# Patient Record
Sex: Female | Born: 1960 | Race: White | Hispanic: No | Marital: Single | Smoking: Current every day smoker
Health system: Southern US, Community
[De-identification: ages and names within clinical notes are randomized; demographics above are authoritative.]

## PROBLEM LIST (undated history)

## (undated) HISTORY — PX: TRACHEOSTOMY: SUR1362

## (undated) HISTORY — PX: BACK SURGERY: SHX140

---

## 2004-01-02 ENCOUNTER — Other Ambulatory Visit: Payer: Self-pay

## 2004-01-03 ENCOUNTER — Other Ambulatory Visit: Payer: Self-pay

## 2017-06-15 ENCOUNTER — Emergency Department: Payer: Self-pay

## 2017-06-15 ENCOUNTER — Encounter: Payer: Self-pay | Admitting: Emergency Medicine

## 2017-06-15 ENCOUNTER — Emergency Department
Admission: EM | Admit: 2017-06-15 | Discharge: 2017-06-15 | Disposition: A | Discharge status: Home / Self Care | Payer: Self-pay | Attending: Emergency Medicine | Admitting: Emergency Medicine

## 2017-06-15 DIAGNOSIS — F17210 Nicotine dependence, cigarettes, uncomplicated: Secondary | ICD-10-CM | POA: Insufficient documentation

## 2017-06-15 DIAGNOSIS — M7552 Bursitis of left shoulder: Secondary | ICD-10-CM | POA: Insufficient documentation

## 2017-06-15 MED ORDER — HYDROCODONE-ACETAMINOPHEN 5-325 MG PO TABS: 1.0000 | ORAL_TABLET | Freq: Four times a day (QID) | ORAL | 0 refills | Status: AC | PRN

## 2017-06-15 MED ORDER — HYDROCODONE-ACETAMINOPHEN 5-325 MG PO TABS
1.0000 | ORAL_TABLET | Freq: Once | ORAL | Status: AC
  Administered 2017-06-15: 1 via ORAL
  Filled 2017-06-15: qty 1

## 2017-06-15 MED ORDER — DEXAMETHASONE SODIUM PHOSPHATE 10 MG/ML IJ SOLN
10.0000 mg | Freq: Once | INTRAMUSCULAR | Status: AC
  Administered 2017-06-15: 10 mg via INTRAMUSCULAR
  Filled 2017-06-15: qty 1

## 2017-06-15 NOTE — Discharge Instructions (Signed)
1. Take pain medicine as needed (Norco #15). 2. Use sling as needed for comfort. 3. Return to the ER for worsening symptoms, persistent vomiting, difficulty breathing or other disorders.

## 2017-06-15 NOTE — ED Triage Notes (Signed)
Patient ambulatory to triage with steady gait, without difficulty or distress noted; pt reports left shoulder pain x 2wks that increases with movement; st hx of same

## 2017-06-15 NOTE — ED Provider Notes (Signed)
[facility] Emergency Department Provider Note   ____________________________________________   First MD Initiated Contact with Patient 06/15/17 [phone]     (approximate)  I have reviewed the triage vital signs and the nursing notes.   HISTORY  Chief Complaint Shoulder Injury    HPI Michelle NunneryCarol Boone is a [age] y.o. female who presents to the ED from home with a chief complaint of nontraumatic left shoulder pain. Patient reports left shoulder injury 15 years ago; never followed up with orthopedic secondary to finances. 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 Patient is right hand dominant. complains of sharp left shoulder pain which is exacerbated with movement. Denies associated fever, chills, chest pain, shortness of breath, abdominal pain, nausea, vomiting. Denies recent travel or trauma. Nothing makes her symptoms better.   Past medical history  None  There are no active problems to display for this patient.   Past Surgical History:  Procedure Laterality Date  . BACK SURGERY    . CESAREAN SECTION    . TRACHEOSTOMY      Prior to Admission medications   Not on File    Allergies Aspirin  No family history on file.  Social History Social History  Substance Use Topics  . Smoking status: Current Every Day Smoker  . Smokeless tobacco: Never Used  . Alcohol use No    Review of Systems  Constitutional: No fever/chills. Eyes: No visual changes. ENT: No sore throat. Cardiovascular: Denies chest pain. Respiratory: Denies shortness of breath. Gastrointestinal: No abdominal pain.  No nausea, no vomiting.  No diarrhea.  No constipation. Genitourinary: Negative for dysuria. Musculoskeletal: positive for left shoulder pain. Negative for back pain. Skin: Negative for rash. Neurological: Negative for headaches, focal weakness or numbness.   ____________________________________________   PHYSICAL EXAM:  VITAL  SIGNS: ED Triage Vitals  Enc Vitals Group     BP 06/15/17 0526 (!) 195/112     Pulse Rate 06/15/17 0526 98     Resp 06/15/17 0526 18     Temp 06/15/17 0526 98.8 F (37.1 C)     Temp Source 06/15/17 0526 Oral     SpO2 06/15/17 0526 100 %     Weight 06/15/17 0521 141 lb (64 kg)     Height 06/15/17 0521 5\' 7"  (1.702 m)     Head Circumference --      Peak Flow --      Pain Score 06/15/17 0521 10     Pain Loc --      Pain Edu? --      Excl. in GC? --     Constitutional: Alert and oriented. Well appearing and in mild acute distress. Eyes: Conjunctivae are normal. PERRL. EOMI. Head: Atraumatic. Nose: No congestion/rhinnorhea. Mouth/Throat: Mucous membranes are moist.  Oropharynx non-erythematous. Neck: No stridor.  No cervical spine tenderness to palpation. Cardiovascular: Normal rate, regular rhythm. Grossly normal heart sounds.  Good peripheral circulation. Respiratory: Normal respiratory effort.  No retractions. Lungs CTAB. Gastrointestinal: Soft and nontender. No distention. No abdominal bruits. No CVA tenderness. Musculoskeletal: Left posterior shoulder tender to palpation. No deformities, swelling or external evidence of injuries noted. Limited range of motion secondary to pain. 2+ radial pulses. Brisk, less than 5 second capillary refill. 5/5 motor strength and sensation intact. Neurologic:  Normal speech and language. No gross focal neurologic deficits are appreciated. No gait instability. Skin:  Skin is warm, dry and intact. No rash noted. Psychiatric: Mood and affect are normal.  Speech and behavior are normal.  ____________________________________________   LABS (all labs ordered are listed, but only abnormal results are displayed)  Labs Reviewed - No data to display ____________________________________________  EKG  None ____________________________________________  RADIOLOGY  Dg Shoulder Left  Result Date: 06/15/2017 CLINICAL DATA:  56 y/o  F; 2 weeks of left  shoulder pain. EXAM: LEFT SHOULDER - 2+ VIEW COMPARISON:  None. FINDINGS: There is no evidence of fracture or dislocation. There is no evidence of arthropathy or other focal bone abnormality. Soft tissues are unremarkable. IMPRESSION: Negative. Electronically Signed   By: Mitzi Hansen M.D.   On: 06/15/2017 05:40    ____________________________________________   PROCEDURES  Procedure(s) performed: None  Procedures  Critical Care performed: No  ____________________________________________   INITIAL IMPRESSION / ASSESSMENT AND PLAN / ED COURSE  As part of my medical decision making, I reviewed the following data within the electronic MEDICAL RECORD NUMBER Nursing notes reviewed and incorporated, Old chart reviewed, Radiograph reviewed and Notes from prior ED visits.   56 year old female who presents with left shoulder pain secondary to bursitis. 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 Denies chest pain or shortness of breath; no suspicion for ACS. Hypertension noted which is most likely secondary to pain. Will place in sling, IM Decadron, analgesia. Strongly encouraged patient to follow-up with orthopedics. Strict return precautions given. Patient verbalizes understanding and agrees with plan of care.      ____________________________________________   FINAL CLINICAL IMPRESSION(S) / ED DIAGNOSES  Final diagnoses:  Bursitis of left shoulder      NEW MEDICATIONS STARTED DURING THIS VISIT:  New Prescriptions   No medications on file     Note:  This document was prepared using Dragon voice recognition software and may include unintentional dictation errors.    Irean Hong, MD 06/15/17 [phone]

## 2019-04-22 ENCOUNTER — Emergency Department
Admission: EM | Admit: 2019-04-22 | Discharge: 2019-04-22 | Disposition: A | Discharge status: Home / Self Care | Payer: Self-pay | Attending: Emergency Medicine | Admitting: Emergency Medicine

## 2019-04-22 ENCOUNTER — Emergency Department: Payer: Self-pay

## 2019-04-22 ENCOUNTER — Other Ambulatory Visit: Payer: Self-pay

## 2019-04-22 ENCOUNTER — Encounter: Payer: Self-pay | Admitting: Emergency Medicine

## 2019-04-22 DIAGNOSIS — R079 Chest pain, unspecified: Secondary | ICD-10-CM

## 2019-04-22 DIAGNOSIS — F17200 Nicotine dependence, unspecified, uncomplicated: Secondary | ICD-10-CM | POA: Insufficient documentation

## 2019-04-22 DIAGNOSIS — R0602 Shortness of breath: Secondary | ICD-10-CM | POA: Insufficient documentation

## 2019-04-22 LAB — BASIC METABOLIC PANEL
Anion gap: 9 (ref 5–15)
BUN: 17 mg/dL (ref 6–20)
CO2: 27 mmol/L (ref 22–32)
Calcium: 9.5 mg/dL (ref 8.9–10.3)
Chloride: 106 mmol/L (ref 98–111)
Creatinine, Ser: 1.06 mg/dL — ABNORMAL HIGH (ref 0.44–1.00)
GFR calc Af Amer: >60 mL/min (ref >60)
GFR calc non Af Amer: 58 mL/min — ABNORMAL LOW (ref >60)
Glucose, Bld: 89 mg/dL (ref 70–99)
Potassium: 4 mmol/L (ref 3.5–5.1)
Sodium: 142 mmol/L (ref 135–145)

## 2019-04-22 LAB — CBC
HCT: 44.9 % (ref 36.0–46.0)
Hemoglobin: 14.5 g/dL (ref 12.0–15.0)
MCH: 28.2 pg (ref 26.0–34.0)
MCHC: 32.3 g/dL (ref 30.0–36.0)
MCV: 87.2 fL (ref 80.0–100.0)
Platelets: 354 10*3/uL (ref 150–400)
RBC: 5.15 MIL/uL — ABNORMAL HIGH (ref 3.87–5.11)
RDW: 13.1 % (ref 11.5–15.5)
WBC: 10.4 10*3/uL (ref 4.0–10.5)
nRBC: 0 % (ref 0.0–0.2)

## 2019-04-22 LAB — TROPONIN I (HIGH SENSITIVITY)
Troponin I (High Sensitivity): 3 ng/L (ref <18)
Troponin I (High Sensitivity): 3 ng/L (ref <18)

## 2019-04-22 MED ORDER — LIDOCAINE 5 % EX PTCH
1.0000 | MEDICATED_PATCH | CUTANEOUS | Status: DC
  Administered 2019-04-22: 09:00:00 1 via TRANSDERMAL
  Filled 2019-04-22: qty 1

## 2019-04-22 MED ORDER — SODIUM CHLORIDE 0.9% FLUSH: 3.0000 mL | Freq: Once | INTRAVENOUS | Status: DC

## 2019-04-22 MED ORDER — ASPIRIN 81 MG PO CHEW
324.0000 mg | CHEWABLE_TABLET | Freq: Once | ORAL | Status: AC
  Administered 2019-04-22: 324 mg via ORAL
  Filled 2019-04-22: qty 4

## 2019-04-22 MED ORDER — LIDOCAINE 5 % EX PTCH: 1.0000 | MEDICATED_PATCH | Freq: Two times a day (BID) | CUTANEOUS | 0 refills | Status: AC

## 2019-04-22 NOTE — ED Provider Notes (Signed)
[facility] Emergency Department Provider Note   ____________________________________________   First MD Initiated Contact with Patient 04/22/19 [phone]     (approximate)  I have reviewed the triage vital signs and the nursing notes.   HISTORY  Chief Complaint Chest Pain    HPI Michelle NunneryCarol Boone is a 58 y.o. female with no significant past medical history who presents to the ED complaining of chest pain.  Patient reports she has been dealing with intermittent pain in her chest over the past couple of months.  She describes it as a sharp pain primarily in the left side of her chest and at her mid sternum, which is exacerbated when she pushes on her chest.  She states it is not associated with exercise or taking a deep breath.  She does states she will occasionally feel short of breath, but she denies any fevers or cough.  She smokes approximately 10 cigarettes/day, reports a family history of heart disease in the 6950s, but denies any personal medical history.        History reviewed. No pertinent past medical history.  There are no active problems to display for this patient.   Past Surgical History:  Procedure Laterality Date  . BACK SURGERY    . CESAREAN SECTION    . TRACHEOSTOMY      Prior to Admission medications   Medication Sig Start Date End Date Taking? Authorizing Provider  HYDROcodone-acetaminophen (NORCO) 5-325 MG tablet Take 1 tablet by mouth every 6 (six) hours as needed for moderate pain. 06/15/17   Irean HongSung, Jade J, MD  lidocaine (LIDODERM) 5 % Place 1 patch onto the skin every 12 (twelve) hours. Remove & Discard patch within 12 hours or as directed by MD 04/22/19 04/21/20  Chesley NoonJessup, Johnda Billiot, MD    Allergies Aspirin  No family history on file.  Social History Social History   Tobacco Use  . Smoking status: Current Every Day Smoker  . Smokeless tobacco: Never Used  Substance Use Topics  . Alcohol use: No  . Drug use: Not on file    Review  of Systems  Constitutional: No fever/chills Eyes: No visual changes. ENT: No sore throat. Cardiovascular: Positive for chest pain. Respiratory: Positive for shortness of breath. Gastrointestinal: No abdominal pain.  No nausea, no vomiting.  No diarrhea.  No constipation. Genitourinary: Negative for dysuria. Musculoskeletal: Negative for back pain. Skin: Negative for rash. Neurological: Negative for headaches, focal weakness or numbness.  ____________________________________________   PHYSICAL EXAM:  VITAL SIGNS: ED Triage Vitals  Enc Vitals Group     BP 04/22/19 0838 (!) 156/107     Pulse Rate 04/22/19 0838 83     Resp 04/22/19 0838 18     Temp 04/22/19 0838 98.5 F (36.9 C)     Temp Source 04/22/19 0838 Oral     SpO2 04/22/19 0838 98 %     Weight 04/22/19 0839 160 lb (72.6 kg)     Height 04/22/19 0839 5\' 6"  (1.676 m)     Head Circumference --      Peak Flow --      Pain Score 04/22/19 0839 10     Pain Loc --      Pain Edu? --      Excl. in GC? --     Constitutional: Alert and oriented. Eyes: Conjunctivae are normal. Head: Atraumatic. Nose: No congestion/rhinnorhea. Mouth/Throat: Mucous membranes are moist. Neck: Normal ROM Cardiovascular: Normal rate, regular rhythm. Grossly normal heart sounds.  2+ radial  pulses bilaterally. Respiratory: Normal respiratory effort.  No retractions. Lungs CTAB.  Left-sided chest wall tenderness to palpation. Gastrointestinal: Soft and nontender. No distention. Genitourinary: deferred Musculoskeletal: No lower extremity tenderness nor edema. Neurologic:  Normal speech and language. No gross focal neurologic deficits are appreciated. Skin:  Skin is warm, dry and intact. No rash noted. Psychiatric: Mood and affect are normal. Speech and behavior are normal.  ____________________________________________   LABS (all labs ordered are listed, but only abnormal results are displayed)  Labs Reviewed  BASIC METABOLIC PANEL - Abnormal;  Notable for the following components:      Result Value   Creatinine, Ser 1.06 (*)    GFR calc non Af Amer 58 (*)    All other components within normal limits  CBC - Abnormal; Notable for the following components:   RBC 5.15 (*)    All other components within normal limits  TROPONIN I (HIGH SENSITIVITY)  TROPONIN I (HIGH SENSITIVITY)   ____________________________________________  EKG  ED ECG REPORT I, Blake Divine, the attending physician, personally viewed and interpreted this ECG.   Date: 04/22/2019  EKG Time: 8:38  Rate: 82  Rhythm: normal sinus rhythm  Axis: Normal  Intervals:none  ST&T Change: None   PROCEDURES  Procedure(s) performed (including Critical Care):  Procedures   ____________________________________________   INITIAL IMPRESSION / ASSESSMENT AND PLAN / ED COURSE       58 year old female presenting to the ED with complaints of intermittent chest pain over the past couple of months, worse this morning.  Initial EKG without acute ischemic changes, relatively low suspicion for ACS given symptoms very atypical and patient with heart score of 2.  Will check chest x-ray, labs including 2 sets of troponin.  Do not suspect PE as patient is low risk by Wells, no signs or symptoms of DVT.  Symptoms appear most consistent with musculoskeletal source versus GERD.  Chest x-ray negative for acute process.  Labs unremarkable, including 2 sets of negative troponin.  Patient report significant improvement in symptoms following aspirin and Lidoderm patch.  Low suspicion for ACS given heart score of less than 4.  Suspect musculoskeletal etiology, counseled patient to establish care with PCP and return to the ED for new or worsening symptoms, patient agrees with plan.      ____________________________________________   FINAL CLINICAL IMPRESSION(S) / ED DIAGNOSES  Final diagnoses:  Nonspecific chest pain     ED Discharge Orders         Ordered    lidocaine  (LIDODERM) 5 %  Every 12 hours     04/22/19 1208           Note:  This document was prepared using Dragon voice recognition software and may include unintentional dictation errors.   Blake Divine, MD 04/22/19 [phone]

## 2019-04-22 NOTE — ED Triage Notes (Signed)
Pt here with c/o midsternal cp that began yesterday while playing with grandson, she was thinking it was just indigestion but it never went away. States occasional SHOB, speaking in full sentences, NAD. Hx of GERD, stopped taking reflux med after 20 years about a year ago. Is wanting to be seen for migraines as well.

## 2019-04-22 NOTE — ED Notes (Signed)
Patient transported to X-ray 

## 2019-04-22 NOTE — ED Notes (Signed)
Sent a green and purple top tube to lab.

## 2019-10-14 ENCOUNTER — Ambulatory Visit (INDEPENDENT_AMBULATORY_CARE_PROVIDER_SITE_OTHER): Payer: Self-pay | Admitting: Adult Health

## 2019-10-14 DIAGNOSIS — Z91199 Patient's noncompliance with other medical treatment and regimen due to unspecified reason: Secondary | ICD-10-CM | POA: Insufficient documentation

## 2019-10-14 DIAGNOSIS — Z5329 Procedure and treatment not carried out because of patient's decision for other reasons: Secondary | ICD-10-CM

## 2019-10-14 NOTE — Progress Notes (Deleted)
Patient: Michelle Boone, Female    DOB: 16-Jun-1961, 59 y.o.   MRN: [id number] Visit Date: 10/14/2019  Today's Provider: Jairo Ben, FNP   Chief Complaint  Patient presents with  . New Patient (Initial Visit)   Subjective:    New Patient Michelle Boone is a 58 y.o. female who presents today for health maintenance and establish care as a new patient. She feels {DESC; WELL/FAIRLY WELL/POORLY:18703}. She reports exercising ***. She reports she is sleeping {DESC; WELL/FAIRLY WELL/POORLY:18703}.  -----------------------------------------------------------------   Review of Systems  All other systems reviewed and are negative.   Social History She  reports that she has been smoking. She has never used smokeless tobacco. She reports that she does not drink alcohol. Social History   Socioeconomic History  . Marital status: Single    Spouse name: Not on file  . Number of children: Not on file  . Years of education: Not on file  . Highest education level: Not on file  Occupational History  . Not on file  Tobacco Use  . Smoking status: Current Every Day Smoker  . Smokeless tobacco: Never Used  Substance and Sexual Activity  . Alcohol use: No  . Drug use: Not on file  . Sexual activity: Not on file  Other Topics Concern  . Not on file  Social History Narrative  . Not on file   Social Determinants of Health   Financial Resource Strain:   . Difficulty of Paying Living Expenses: Not on file  Food Insecurity:   . Worried About Programme researcher, broadcasting/film/video in the Last Year: Not on file  . Ran Out of Food in the Last Year: Not on file  Transportation Needs:   . Lack of Transportation (Medical): Not on file  . Lack of Transportation (Non-Medical): Not on file  Physical Activity:   . Days of Exercise per Week: Not on file  . Minutes of Exercise per Session: Not on file  Stress:   . Feeling of Stress : Not on file  Social Connections:   . Frequency of Communication with  Friends and Family: Not on file  . Frequency of Social Gatherings with Friends and Family: Not on file  . Attends Religious Services: Not on file  . Active Member of Clubs or Organizations: Not on file  . Attends Banker Meetings: Not on file  . Marital Status: Not on file    There are no problems to display for this patient.   Past Surgical History:  Procedure Laterality Date  . BACK SURGERY    . CESAREAN SECTION    . TRACHEOSTOMY      Family History  No family status information on file.   Her family history is not on file.     Allergies  Allergen Reactions  . Aspirin     Previous Medications   HYDROCODONE-ACETAMINOPHEN (NORCO) 5-325 MG TABLET    Take 1 tablet by mouth every 6 (six) hours as needed for moderate pain.   LIDOCAINE (LIDODERM) 5 %    Place 1 patch onto the skin every 12 (twelve) hours. Remove & Discard patch within 12 hours or as directed by MD    Patient Care Team: Patient, No Pcp Per as PCP - General (General Practice)      Objective:   Vitals: There were no vitals taken for this visit.   Physical Exam   Depression Screen No flowsheet data found.    Assessment & Plan:  Routine Health Maintenance and Physical Exam  Exercise Activities and Dietary recommendations Goals   None      There is no immunization history on file for this patient.  There are no preventive care reminders to display for this patient.   Discussed health benefits of physical activity, and encouraged her to engage in regular exercise appropriate for her age and condition.    --------------------------------------------------------------------

## 2020-06-13 ENCOUNTER — Emergency Department: Payer: Self-pay

## 2020-06-13 ENCOUNTER — Emergency Department
Admission: EM | Admit: 2020-06-13 | Discharge: 2020-06-13 | Disposition: A | Discharge status: Home / Self Care | Payer: Self-pay | Attending: Emergency Medicine | Admitting: Emergency Medicine

## 2020-06-13 DIAGNOSIS — R110 Nausea: Secondary | ICD-10-CM | POA: Insufficient documentation

## 2020-06-13 DIAGNOSIS — N83201 Unspecified ovarian cyst, right side: Secondary | ICD-10-CM | POA: Insufficient documentation

## 2020-06-13 DIAGNOSIS — F17200 Nicotine dependence, unspecified, uncomplicated: Secondary | ICD-10-CM | POA: Insufficient documentation

## 2020-06-13 LAB — COMPREHENSIVE METABOLIC PANEL
ALT: 11 U/L (ref 0–44)
AST: 17 U/L (ref 15–41)
Albumin: 4.4 g/dL (ref 3.5–5.0)
Alkaline Phosphatase: 70 U/L (ref 38–126)
Anion gap: 10 (ref 5–15)
BUN: 17 mg/dL (ref 6–20)
CO2: 25 mmol/L (ref 22–32)
Calcium: 9 mg/dL (ref 8.9–10.3)
Chloride: 103 mmol/L (ref 98–111)
Creatinine, Ser: 0.96 mg/dL (ref 0.44–1.00)
GFR, Estimated: >60 mL/min (ref >60)
Glucose, Bld: 85 mg/dL (ref 70–99)
Potassium: 3.3 mmol/L — ABNORMAL LOW (ref 3.5–5.1)
Sodium: 138 mmol/L (ref 135–145)
Total Bilirubin: 0.7 mg/dL (ref 0.3–1.2)
Total Protein: 7.9 g/dL (ref 6.5–8.1)

## 2020-06-13 LAB — CBC
HCT: 46.2 % — ABNORMAL HIGH (ref 36.0–46.0)
Hemoglobin: 15.4 g/dL — ABNORMAL HIGH (ref 12.0–15.0)
MCH: 28.8 pg (ref 26.0–34.0)
MCHC: 33.3 g/dL (ref 30.0–36.0)
MCV: 86.5 fL (ref 80.0–100.0)
Platelets: 344 10*3/uL (ref 150–400)
RBC: 5.34 MIL/uL — ABNORMAL HIGH (ref 3.87–5.11)
RDW: 13.3 % (ref 11.5–15.5)
WBC: 11.5 10*3/uL — ABNORMAL HIGH (ref 4.0–10.5)
nRBC: 0 % (ref 0.0–0.2)

## 2020-06-13 MED ORDER — IOHEXOL 300 MG/ML  SOLN
100.0000 mL | Freq: Once | INTRAMUSCULAR | Status: AC | PRN
  Administered 2020-06-13: 100 mL via INTRAVENOUS

## 2020-06-13 MED ORDER — IOHEXOL 9 MG/ML PO SOLN
500.0000 mL | Freq: Two times a day (BID) | ORAL | Status: DC | PRN
  Administered 2020-06-13 (×2): 500 mL via ORAL

## 2020-06-13 NOTE — ED Notes (Signed)
Pt remains denying pain at this time. Side rails up x2, call bell within reach. Pt has blankets and lights dimmed for comfort.

## 2020-06-13 NOTE — ED Triage Notes (Signed)
Patient to ED, has been light headed, right side pain, high blood pressure, doesn't feel right, says she is having a lot of pressure pain, Denies N/V. "gives plasma a lot." Knows that she has something wrong. States strong family history of cancer and is getting a little anxious that maybe this is her cancer diagnosis. Symptoms have been present for a couple of months. "I don't have a doctor and don't have insurance so I don't go.:

## 2020-06-13 NOTE — ED Provider Notes (Signed)
[facility] Emergency Department Provider Note  ____________________________________________   First MD Initiated Contact with Patient 06/13/20 0153     (approximate)  I have reviewed the triage vital signs and the nursing notes.   HISTORY  Chief Complaint Doesn't feel right (See notes)    HPI Michelle Boone is a 59 y.o. female presents to the emergency department secondary to generalized abdominal pain predominantly in the right side of the abdomen times "couple months".  Patient also admits to approximate 40 pound weight loss over period of months.  Patient admits to nausea no vomiting.  Patient denies any fever.  Patient denies any urinary symptoms.        History reviewed. No pertinent past medical history.  Patient Active Problem List   Diagnosis Date Noted  . No-show for appointment 10/14/2019    Past Surgical History:  Procedure Laterality Date  . BACK SURGERY    . CESAREAN SECTION    . TRACHEOSTOMY      Prior to Admission medications   Medication Sig Start Date End Date Taking? Authorizing Provider  HYDROcodone-acetaminophen (NORCO) 5-325 MG tablet Take 1 tablet by mouth every 6 (six) hours as needed for moderate pain. 06/15/17   Irean Hong, MD    Allergies Aspirin  No family history on file.  Social History Social History   Tobacco Use  . Smoking status: Current Every Day Smoker  . Smokeless tobacco: Never Used  Substance Use Topics  . Alcohol use: No  . Drug use: Not on file    Review of Systems Constitutional: No fever/chills Eyes: No visual changes. ENT: No sore throat. Cardiovascular: Denies chest pain. Respiratory: Denies shortness of breath. Gastrointestinal: Positive for abdominal pain and nausea.  No diarrhea.  No constipation. Genitourinary: Negative for dysuria. Musculoskeletal: Negative for neck pain.  Negative for back pain. Integumentary: Negative for rash. Neurological: Negative for headaches, focal  weakness or numbness. \  ____________________________________________   PHYSICAL EXAM:  VITAL SIGNS: ED Triage Vitals  Enc Vitals Group     BP 06/13/20 0025 (!) 218/92     Pulse Rate 06/13/20 0025 98     Resp 06/13/20 0025 20     Temp 06/13/20 0025 98.1 F (36.7 C)     Temp Source 06/13/20 0025 Oral     SpO2 06/13/20 0025 100 %     Weight 06/13/20 0026 58.5 kg (129 lb)     Height 06/13/20 0026 1.702 m (5\' 7" )     Head Circumference --      Peak Flow --      Pain Score 06/13/20 0026 0     Pain Loc --      Pain Edu? --      Excl. in GC? --     Constitutional: Alert and oriented.  Eyes: Conjunctivae are normal.  Head: Atraumatic. Mouth/Throat: Patient is wearing a mask. Neck: No stridor.  No meningeal signs.   Cardiovascular: Normal rate, regular rhythm. Good peripheral circulation. Grossly normal heart sounds. Respiratory: Normal respiratory effort.  No retractions. Gastrointestinal: Generalized tenderness to palpation.  Positive abdominal distention Musculoskeletal: No lower extremity tenderness nor edema. No gross deformities of extremities. Neurologic:  Normal speech and language. No gross focal neurologic deficits are appreciated.  Skin:  Skin is warm, dry and intact. Psychiatric: Mood and affect are normal. Speech and behavior are normal.  ____________________________________________   LABS (all labs ordered are listed, but only abnormal results are displayed)  Labs Reviewed  CBC -  Abnormal; Notable for the following components:      Result Value   WBC 11.5 (*)    RBC 5.34 (*)    Hemoglobin 15.4 (*)    HCT 46.2 (*)    All other components within normal limits  COMPREHENSIVE METABOLIC PANEL - Abnormal; Notable for the following components:   Potassium 3.3 (*)    All other components within normal limits   _______________________________________  RADIOLOGY I, McGrew N Aneesh Faller, personally viewed and evaluated these images (plain radiographs) as part of my  medical decision making, as well as reviewing the written report by the radiologist.   Official radiology report(s): DG Chest 2 View  Result Date: 06/13/2020 CLINICAL DATA:  Cough, weight loss EXAM: CHEST - 2 VIEW COMPARISON:  04/22/2019 FINDINGS: Lungs are clear.  No pleural effusion or pneumothorax. The heart is normal in size. Visualized osseous structures are within normal limits. IMPRESSION: Normal chest radiographs. Electronically Signed   By: Charline Bills M.D.   On: 06/13/2020 04:25   CT ABDOMEN PELVIS W CONTRAST  Result Date: 06/13/2020 CLINICAL DATA:  Right-sided abdominal pain EXAM: CT ABDOMEN AND PELVIS WITH CONTRAST TECHNIQUE: Multidetector CT imaging of the abdomen and pelvis was performed using the standard protocol following bolus administration of intravenous contrast. CONTRAST:  OMNIPAQUE IOHEXOL 300 MG/ML  SOLN COMPARISON:  None. FINDINGS: LOWER CHEST: Normal. HEPATOBILIARY: Normal hepatic contours. No intra- or extrahepatic biliary dilatation. Normal gallbladder. PANCREAS: Normal pancreas. No ductal dilatation or peripancreatic fluid collection. SPLEEN: Normal. ADRENALS/URINARY TRACT: The adrenal glands are normal. 3.4 cm exophytic left renal cyst. The urinary bladder is normal for degree of distention STOMACH/BOWEL: There is no hiatal hernia. Normal duodenal course and caliber. No small bowel dilatation or inflammation. No focal colonic abnormality. Normal appendix. VASCULAR/LYMPHATIC: Normal course and caliber of the major abdominal vessels. No abdominal or pelvic lymphadenopathy. REPRODUCTIVE: There is a large cystic mass of the pelvis that measures 19.7 x 11.1 x 24.5 cm. There are multiple internal septations. MUSCULOSKELETAL. No bony spinal canal stenosis or focal osseous abnormality. OTHER: None. IMPRESSION: 1. 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 Gynecologic consultation is recommended. 2. No acute  abnormality of the abdomen or pelvis. Electronically Signed   By: Deatra Robinson M.D.   On: 06/13/2020 04:23    _  Procedures   ____________________________________________   INITIAL IMPRESSION / MDM / ASSESSMENT AND PLAN / ED COURSE  As part of my medical decision making, I reviewed the following data within the electronic MEDICAL RECORD NUMBER   59 year old female presented with above-stated history and physical exam inserting for intra-abdominal neoplasm.  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  Patient discussed with Dr. Tiburcio Pea gynecology on-call who recommended transfer to [facility] or [facility].  [facility] transfer center contacted however they are currently at capacity.  Patient discussed with gynecology oncology fellow at [facility] who assured me that the patient be contacted by the [facility] gynecologic clinic representative tomorrow to arrange a outpatient follow-up appointment..  ____________________________________________  FINAL CLINICAL IMPRESSION(S) / ED DIAGNOSES  Final diagnoses:  Cyst of right ovary     MEDICATIONS GIVEN DURING THIS VISIT:  Medications  iohexol (OMNIPAQUE) 9 MG/ML oral solution 500 mL (500 mLs Oral Contrast Given 06/13/20 0221)  iohexol (OMNIPAQUE) 300 MG/ML solution 100 mL (100 mLs Intravenous Contrast Given 06/13/20  Wendell.Pavlov)     ED Discharge Orders    None      *Please note:  Edgar Corrigan was evaluated in Emergency Department on 06/13/2020 for the symptoms described in the history of present illness. She was evaluated in the context of the global COVID-19 pandemic, which necessitated consideration that the patient might be at risk for infection with the SARS-CoV-2 virus that causes COVID-19. Institutional protocols and algorithms that pertain to the evaluation of patients at risk for COVID-19 are in a state of rapid change based on information released  by regulatory bodies including the CDC and federal and state organizations. These policies and algorithms were followed during the patient's care in the ED.  Some ED evaluations and interventions may be delayed as a result of limited staffing during and after the pandemic.*  Note:  This document was prepared using Dragon voice recognition software and may include unintentional dictation errors.   Darci Current, MD 06/13/20 [phone]

## 2020-06-13 NOTE — ED Notes (Signed)
Karen to PowerShare images to [facility]. 

## 2020-06-13 NOTE — ED Notes (Signed)
Michelle Boone to PowerShare images to [facility]. 

## 2020-06-13 NOTE — ED Notes (Signed)
Assumed care of pt upon being roomed. Denies pain at this time. Reports pain started in R back "a few months ago" and gets worse while she is working. States pain is intermittent in left flank region. Reports "pink spotting" when wiping, uncertain if it is vaginal discharge. AO x4. Talking in full sentences with regular and unlabored breathing.

## 2020-06-13 NOTE — ED Notes (Signed)
NAD noted at time of D/C. Pt denies questions or concerns. Pt given resources regarding social services follow up, and medical records to get copies of her medical records. Pt ambulatory to the lobby at this time.

## 2020-06-13 NOTE — ED Triage Notes (Signed)
FIRST NURSE NOTE:  Pt c/o right side pain states it has been going on for the last several weeks.

## 2020-06-14 LAB — CEA: CEA: 5.9 ng/mL — ABNORMAL HIGH (ref 0.0–4.7)

## 2020-06-15 ENCOUNTER — Telehealth: Payer: Self-pay

## 2020-06-15 LAB — CA 19-9 (SERIAL): CA 19-9: 2 U/mL (ref 0–35)

## 2020-06-15 NOTE — Telephone Encounter (Signed)
Call made to attempt to arrange consult appointment with gyn oncology. No voicemail on listed home phone. Left voicemail on daughter's voicmail, that is listed as patient contact.

## 2020-06-15 NOTE — Telephone Encounter (Signed)
Daughter, Misty Stanley returned call. She reports her mother is very overwhelmed and was not able to really explain what was found in the ED. She will likely move to [facility] to receive care close to her daughter. We will see her here, in the gyn oncology clinic, 10/20 at 1100 and can help initiate any further workup and facilitate her care.

## 2020-06-15 NOTE — Telephone Encounter (Signed)
Received message from [facility] to attempt to link records. Attempt unsuccessful.

## 2020-06-16 ENCOUNTER — Inpatient Hospital Stay: Payer: Self-pay

## 2020-06-16 ENCOUNTER — Inpatient Hospital Stay: Payer: Self-pay | Attending: Obstetrics and Gynecology | Admitting: Obstetrics and Gynecology

## 2020-06-16 ENCOUNTER — Encounter: Payer: Self-pay | Admitting: Nurse Practitioner

## 2020-06-16 ENCOUNTER — Other Ambulatory Visit: Payer: Self-pay

## 2020-06-16 VITALS — BP 131/79 | HR 75 | Temp 98.1°F | Resp 18 | Wt 125.7 lb

## 2020-06-16 DIAGNOSIS — F17210 Nicotine dependence, cigarettes, uncomplicated: Secondary | ICD-10-CM | POA: Insufficient documentation

## 2020-06-16 DIAGNOSIS — N83201 Unspecified ovarian cyst, right side: Secondary | ICD-10-CM | POA: Insufficient documentation

## 2020-06-16 DIAGNOSIS — R1900 Intra-abdominal and pelvic swelling, mass and lump, unspecified site: Secondary | ICD-10-CM | POA: Insufficient documentation

## 2020-06-16 NOTE — Progress Notes (Signed)
Gynecologic Oncology Consult Visit   Referring Provider: Bayard Males, MD  Chief Concern: Large pelvic mass  Subjective:  Michelle Boone is a 59 y.o. P3 female who is seen in consultation from Dr. Manson Passey for pelvic mass.    She was in the ED 06/13/20 for abdominal pain and 40 lb. weight loss and constipation.  She thinks the mass has grown fairly rapidly.   CA19-9 normal. CA125 not done.   CT scan REPRODUCTIVE: There is a large cystic mass of the pelvis that measures 19.7 x 11.1 x 24.5 cm. There are multiple internal septations. IMPRESSION: 1. 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  2. No acute abnormality of the abdomen or pelvis.  C section x 3 and TL Smokes 1PPD for 40 years.  Past Medical History: No past medical history on file.  Past Surgical History: Past Surgical History:  Procedure Laterality Date  . BACK SURGERY    . CESAREAN SECTION    . TRACHEOSTOMY     Family History: No family history on file.  Social History: Social History   Socioeconomic History  . Marital status: Single    Spouse name: Not on file  . Number of children: Not on file  . Years of education: Not on file  . Highest education level: Not on file  Occupational History  . Not on file  Tobacco Use  . Smoking status: Current Every Day Smoker  . Smokeless tobacco: Never Used  Substance and Sexual Activity  . Alcohol use: No  . Drug use: Not on file  . Sexual activity: Not on file  Other Topics Concern  . Not on file  Social History Narrative  . Not on file   Social Determinants of Health   Financial Resource Strain:   . Difficulty of Paying Living Expenses: Not on file  Food Insecurity:   . Worried About Programme researcher, broadcasting/film/video in the Last Year: Not on file  . Ran Out of Food in the Last Year: Not on file  Transportation Needs:   . Lack of Transportation (Medical): Not on file  . Lack of Transportation (Non-Medical):  Not on file  Physical Activity:   . Days of Exercise per Week: Not on file  . Minutes of Exercise per Session: Not on file  Stress:   . Feeling of Stress : Not on file  Social Connections:   . Frequency of Communication with Friends and Family: Not on file  . Frequency of Social Gatherings with Friends and Family: Not on file  . Attends Religious Services: Not on file  . Active Member of Clubs or Organizations: Not on file  . Attends Banker Meetings: Not on file  . Marital Status: Not on file  Intimate Partner Violence:   . Fear of Current or Ex-Partner: Not on file  . Emotionally Abused: Not on file  . Physically Abused: Not on file  . Sexually Abused: Not on file    Allergies: Allergies  Allergen Reactions  . Aspirin     Current Medications: Current Outpatient Medications  Medication Sig Dispense Refill  . HYDROcodone-acetaminophen (NORCO) 5-325 MG tablet Take 1 tablet by mouth every 6 (six) hours as needed for moderate pain. (Patient not taking: Reported on 06/16/2020) 15 tablet 0   No current facility-administered medications for this visit.    Review of Systems General: negative for, fevers, chills, fatigue, changes in sleep, changes in  weight or appetite Skin: negative for changes in color, texture, moles or lesions Eyes: negative for, changes in vision, pain, diplopia HEENT: negative for, change in hearing, pain, discharge, tinnitus, vertigo, voice changes, sore throat, neck masses Breasts: negative for breast lumps Pulmonary: negative for, dyspnea, orthopnea, productive cough Cardiac: negative for, palpitations, syncope, pain, discomfort, pressure Gastrointestinal: negative for, dysphagia, nausea, vomiting, jaundice, pain, diarrhea, hematemesis, hematochezia Genitourinary/Sexual: negative for, dysuria, discharge, hesitancy, nocturia, retention, stones, infections, STD's, incontinence Ob/Gyn: negative for, irregular bleeding, pain Musculoskeletal:  negative for, pain, stiffness, swelling, range of motion limitation Hematology: negative for, easy bruising, bleeding Neurologic/Psych: negative for, headaches, seizures, paralysis, weakness, tremor, change in gait, change in sensation, mood swings, depression, anxiety, change in memory  Objective:  Physical Examination:  BP 131/79   Pulse 75   Temp 98.1 F (36.7 C) (Oral)   Resp 18   Wt 125 lb 11.2 oz (57 kg)   BMI 19.69 kg/m    ECOG Performance Status: 1 - Symptomatic but completely ambulatory  General appearance: alert, cooperative and appears stated age HEENT:PERRLA, neck supple with midline trachea and thyroid without masses Lymph node survey: non-palpable, axillary, inguinal, supraclavicular Cardiovascular: regular rate and rhythm, no murmurs or gallops Respiratory: normal air entry, lungs clear to auscultation and no rales, rhonchi or wheezing Breast exam: not examined. Abdomen: distended, mass palpable in lower and mid abdomen, no hernias and well healed incision Back: inspection of back is normal Extremities: extremities normal, atraumatic, no cyanosis or edema Skin exam - normal coloration and turgor, no rashes, no suspicious skin lesions noted. Neurological exam reveals alert, oriented, normal speech, no focal findings or movement disorder noted.  Pelvic: exam chaperoned by nurse;  Vulva: normal appearing vulva with no masses, tenderness or lesions; Vagina: normal vagina; Adnexa: large cystic mass; Uterus: uterus is normal size, shape, consistency and nontender; Cervix: anteverted; Rectal: normal rectal, mass pressing down into vagina    Assessment:  Michelle Boone is a 59 y.o. female diagnosed with large symptomatic cystic pelvic and abdominal mass.  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   She has not gotten the COVID vaccine.      Medical co-morbidities complicating care: smokes 1 PPD for 40 years.  Plan:   Problem List Items Addressed  This Visit    None    Visit Diagnoses    Cyst of right ovary    -  Primary   Relevant Orders   CA 125     We discussed options for management including surgical excision with BSO and possible hysterectomy and surgical staging if cancer found on frozen section.  Surgery may require laparotomy, but it may be possible to decompressed the mass via a small incision and then MIS can be performed.  She is moving to [facility] tomorrow to be with her daughter.  Will refer her to my former fellow Eldridge Dace, who is chief of Gynecologic Oncology at [facility] cancer center. Will order CA125 today.    The patient's diagnosis, an outline of the further diagnostic and laboratory studies which will be required, the recommendation, and alternatives were discussed.  All questions were answered to the patient's satisfaction.  A total of 40 minutes were spent with the patient/family today; 60% was spent in education, counseling and coordination of care for pelvic mass.    Leida Lauth, MD

## 2020-06-16 NOTE — Progress Notes (Signed)
Patient moving to [place], [place] tomorrow. Dr. Johnnette Litter recommends referral to Dr. Katrina Stack at [facility]. On her behalf, I contacted [facility] and registered her as a patient. MRN assigned: [id number]. They advised me to fax her records to [facility]. Given her insurance, they will have a financial counselor reach out to her to determine if she wishes to receive her care at [facility] and if so, then schedule an appointment for her. Records faxed today.   MRN at [facility]: [id number] Fax: [phone]

## 2020-06-17 ENCOUNTER — Inpatient Hospital Stay: Payer: Self-pay

## 2022-05-04 IMAGING — CT CT ABD-PELV W/ CM
2 of 5 series · 16 of 46 positions shown, 18 images · IV contrast (APPLIED)
Comparison: None.

CLINICAL DATA: Right-sided abdominal pain

EXAM:
CT ABDOMEN AND PELVIS WITH CONTRAST
TECHNIQUE: Multidetector CT imaging of the abdomen and pelvis was performed
using the standard protocol following bolus administration of
intravenous contrast.
CONTRAST:  100mL OMNIPAQUE IOHEXOL 300 MG/ML  SOLN

[Series 2: routine abd/pel with · axial · 0.71mm/px · z∈[-1259,-859]mm · 13 of 90 slices shown, 15 images]
[im 5/90  soft-tissue]
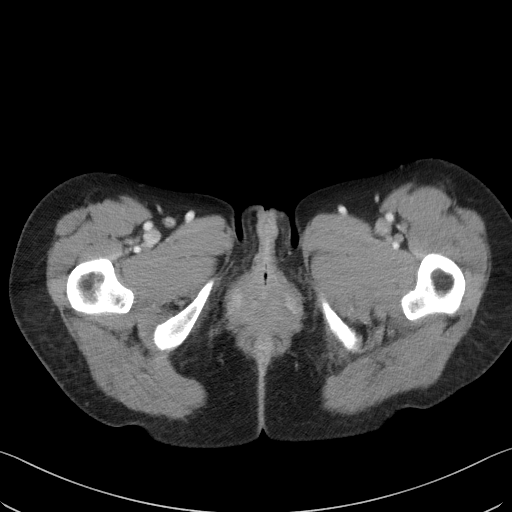
[im 5/90  bone]
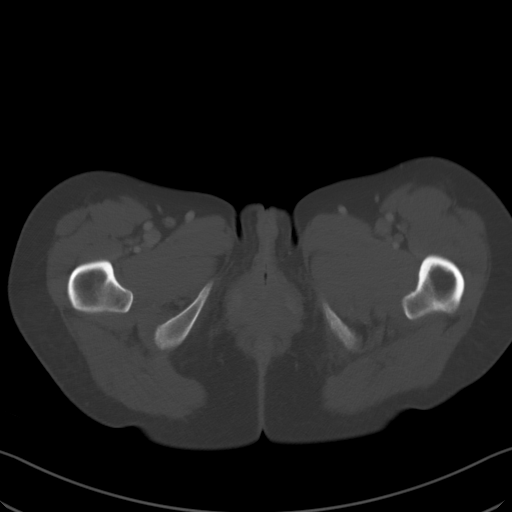
[im 10/90  soft-tissue]
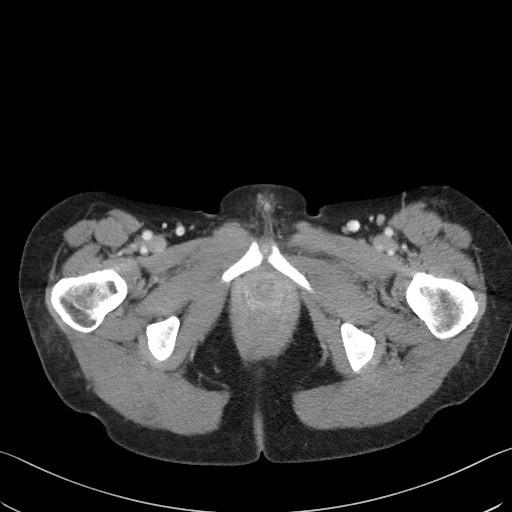
[im 20/90  soft-tissue]
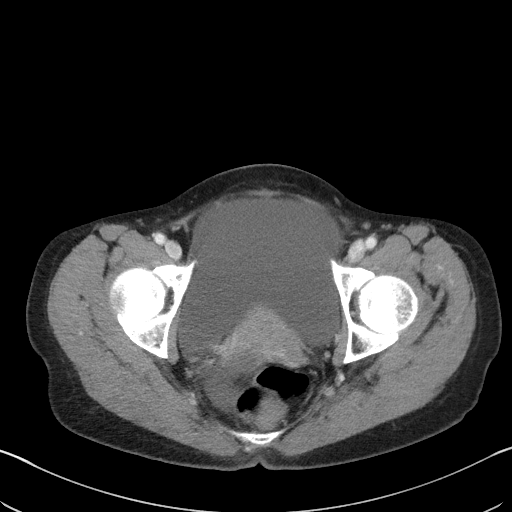
[im 25/90  soft-tissue]
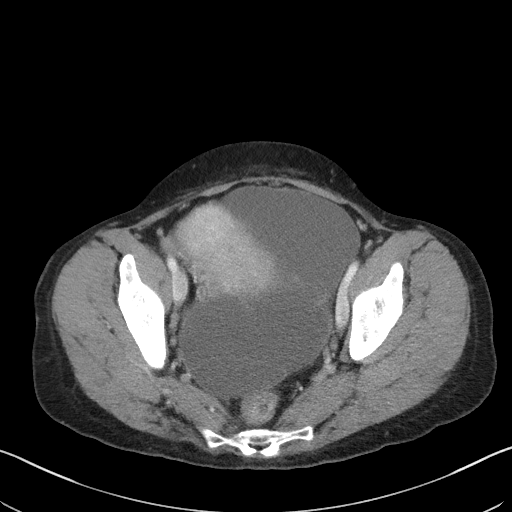
[im 30/90  soft-tissue]
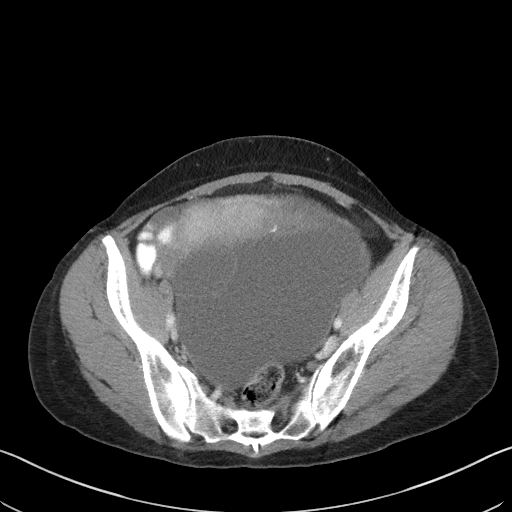
[im 40/90  soft-tissue]
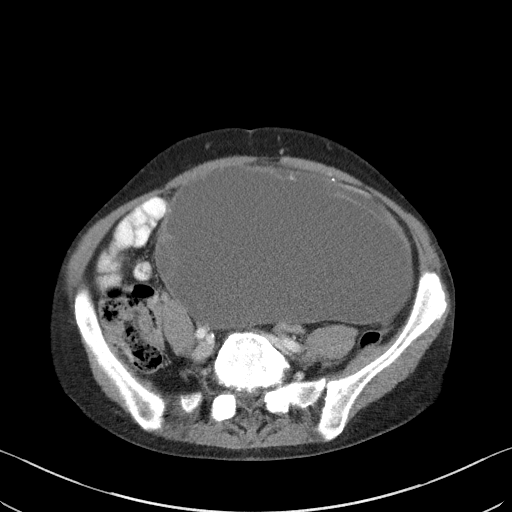
[im 45/90  soft-tissue]
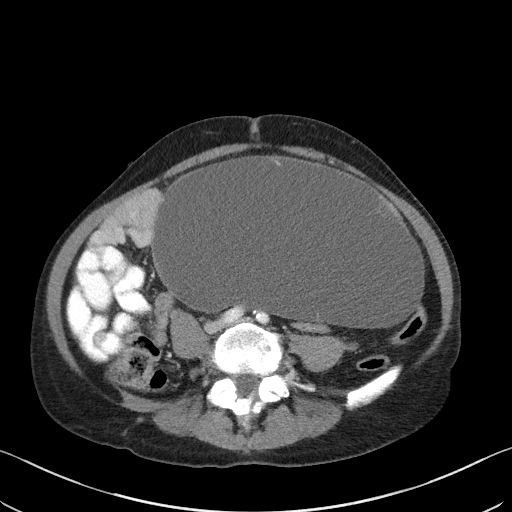
[im 50/90  soft-tissue]
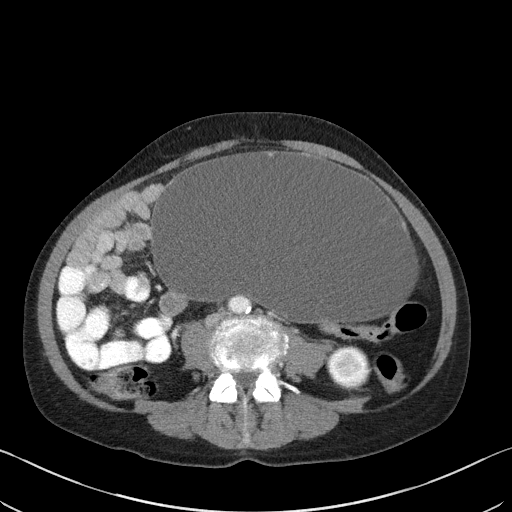
[im 60/90  soft-tissue]
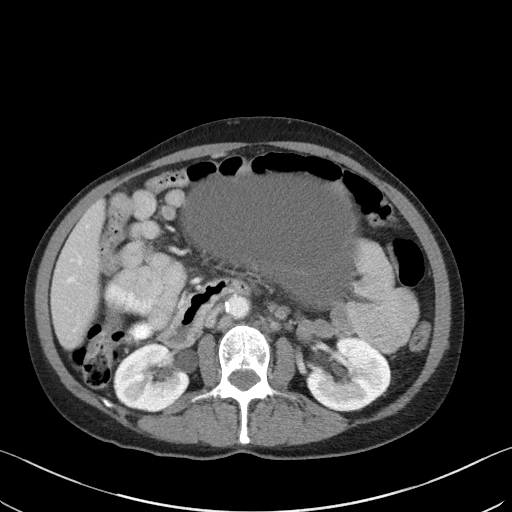
[im 60/90  bone]
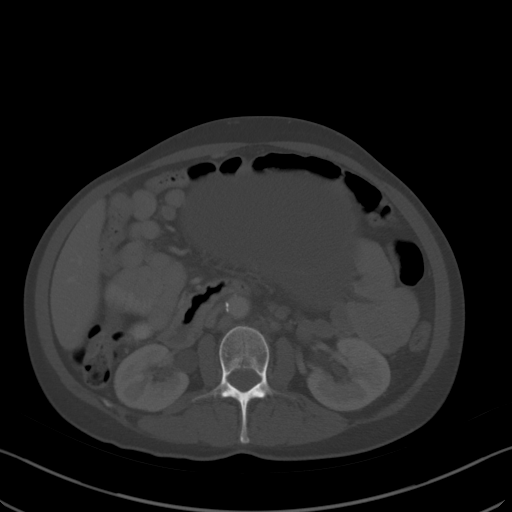
[im 65/90  soft-tissue]
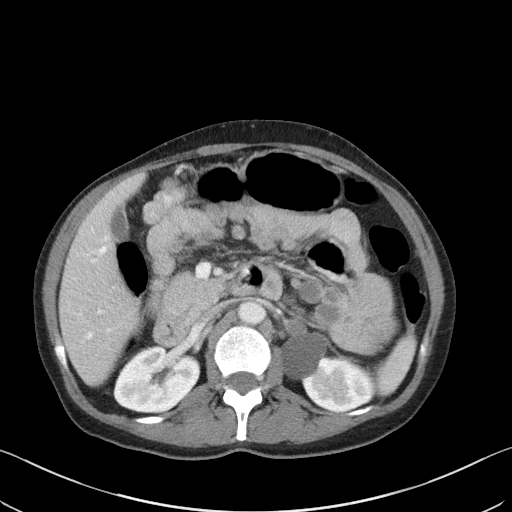
[im 70/90  soft-tissue]
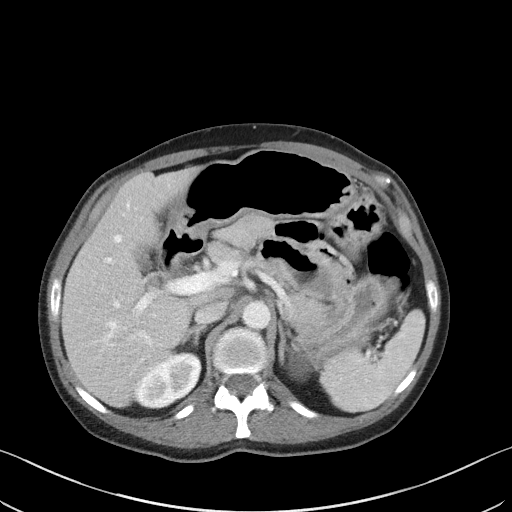
[im 80/90  soft-tissue]
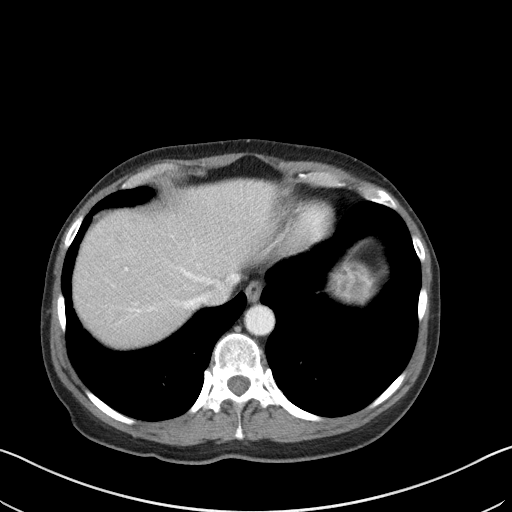
[im 85/90  soft-tissue]
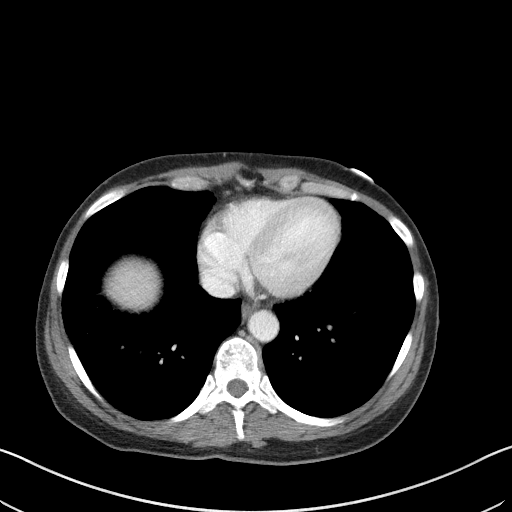

[Series 5: coronal st · coronal · 0.63mm/px · 3 of 87 slices shown]
[im 29/87  soft-tissue]
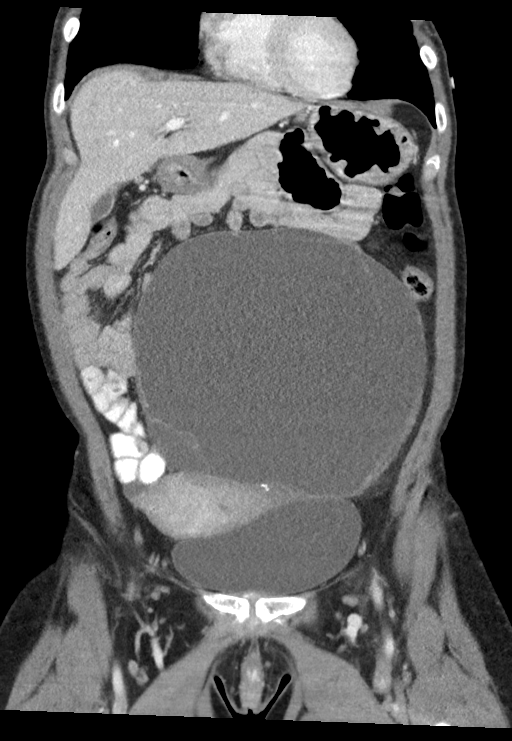
[im 39/87  soft-tissue]
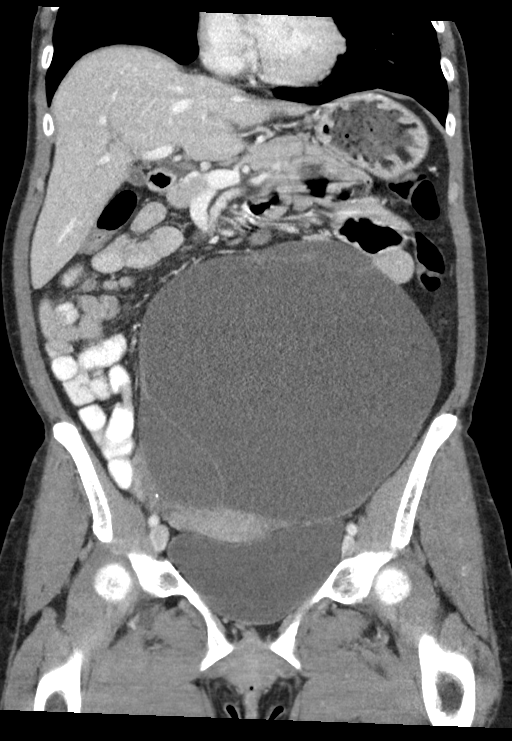
[im 48/87  soft-tissue]
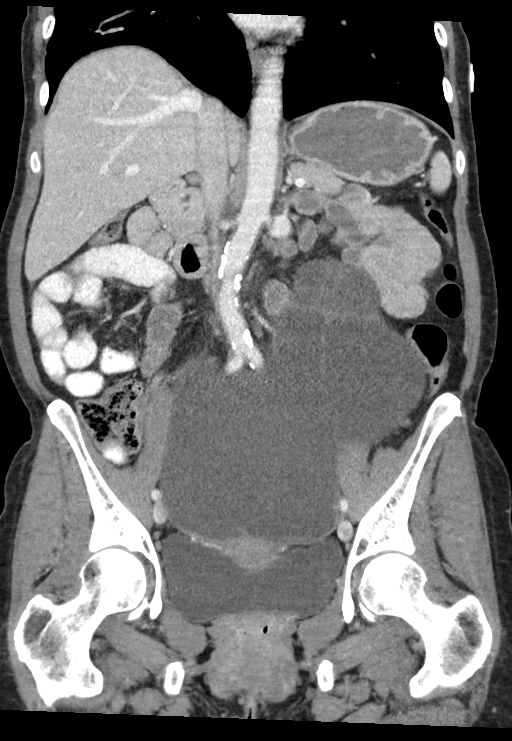

[16 of 46 positions shown; findings below may reference images not displayed]

FINDINGS: LOWER CHEST: Normal.

HEPATOBILIARY: Normal hepatic contours. No intra- or extrahepatic
biliary dilatation. Normal gallbladder.

PANCREAS: Normal pancreas. No ductal dilatation or peripancreatic
fluid collection.

SPLEEN: Normal.

ADRENALS/URINARY TRACT: The adrenal glands are normal. 3.4 cm
exophytic left renal cyst. The urinary bladder is normal for degree
of distention

STOMACH/BOWEL: There is no hiatal hernia. Normal duodenal course and
caliber. No small bowel dilatation or inflammation. No focal colonic
abnormality. Normal appendix.

VASCULAR/LYMPHATIC: Normal course and caliber of the major abdominal
vessels. No abdominal or pelvic lymphadenopathy.

REPRODUCTIVE: There is a large cystic mass of the pelvis that
measures 19.7 x 11.1 x 24.5 cm. There are multiple internal
septations.

MUSCULOSKELETAL. No bony spinal canal stenosis or focal osseous
abnormality.

OTHER: None.
IMPRESSION: 1. Large cystic mass of the pelvis, measuring up to 19.7 x 11.1 x
24.5 cm, likely a cystic ovarian neoplasm. Gynecologic consultation
is recommended.
2. No acute abnormality of the abdomen or pelvis.

## 2022-05-04 IMAGING — CR DG CHEST 2V
1 series · 2 of 2 positions shown · non-contrast
Comparison: 04/22/2019

CLINICAL DATA: Cough, weight loss

EXAM:
CHEST - 2 VIEW

[Series 1: dg chest 2 view · 0.14mm/px · 2 of 2 slices shown]
[im 1/2]
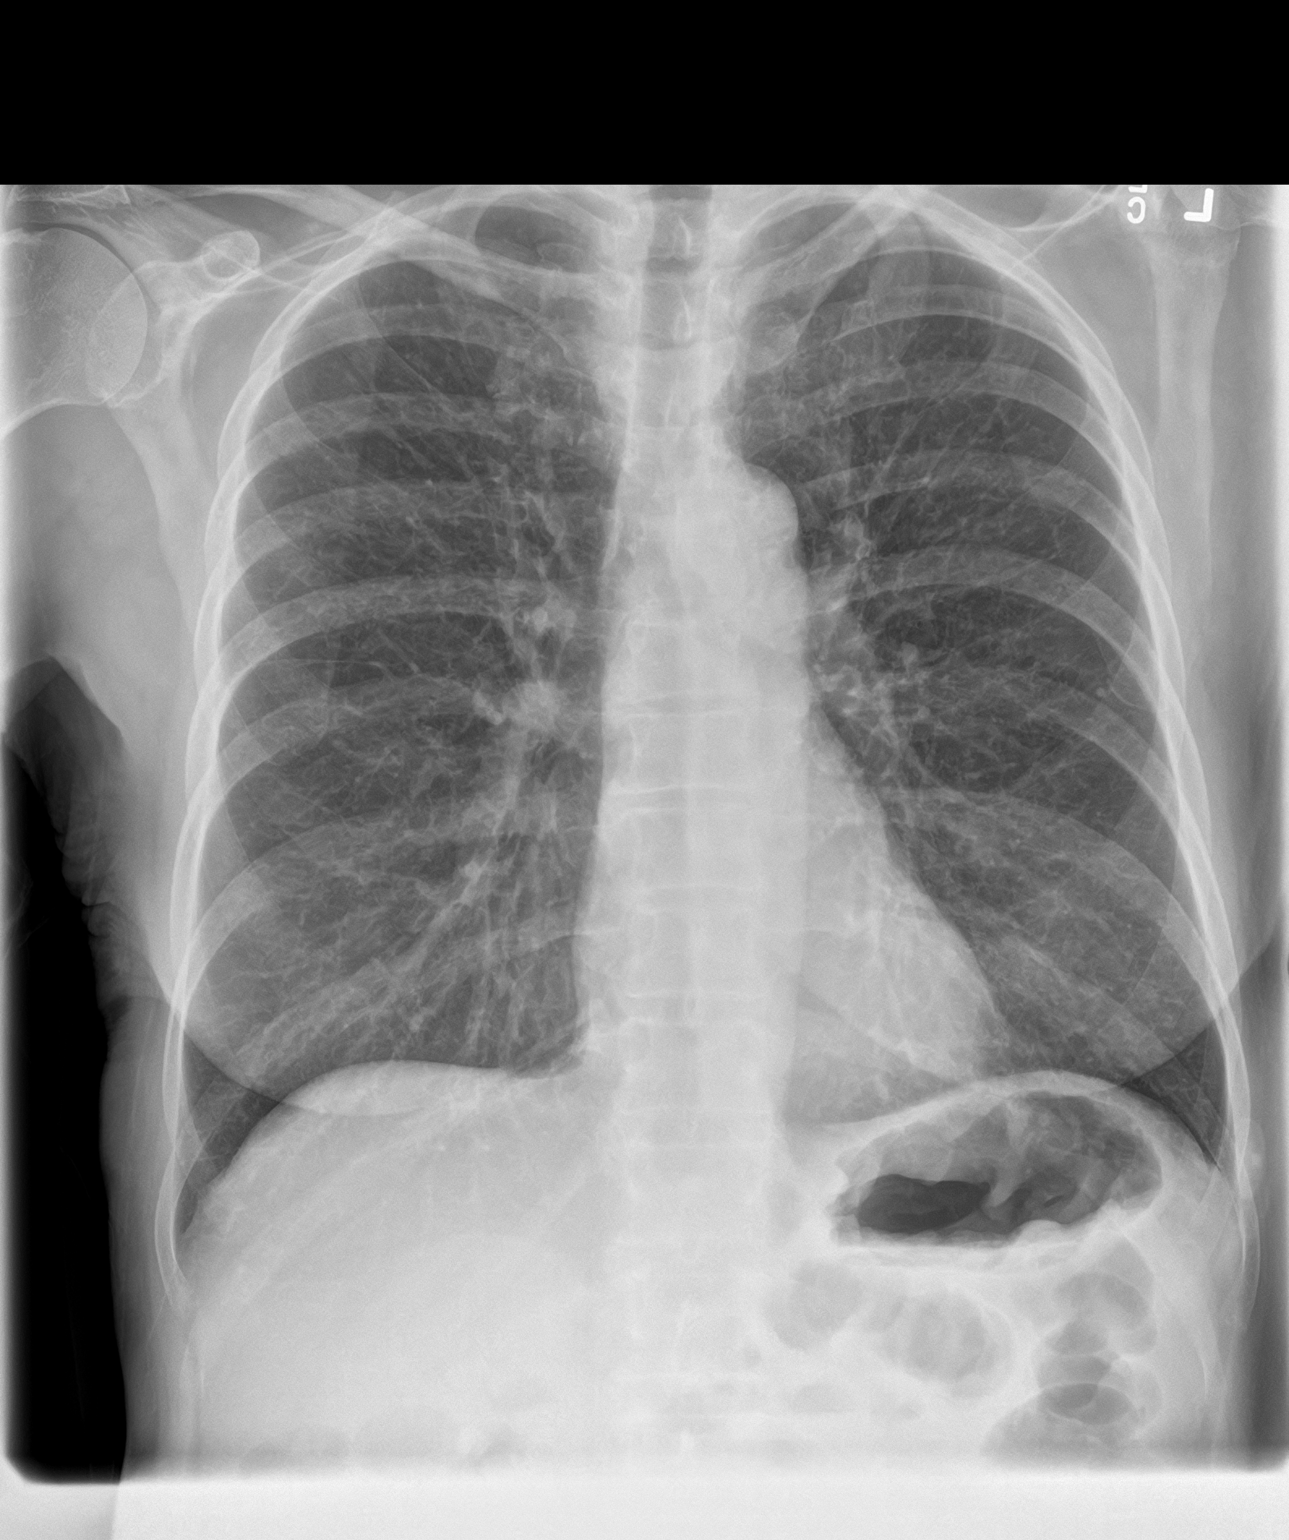
[im 2/2]
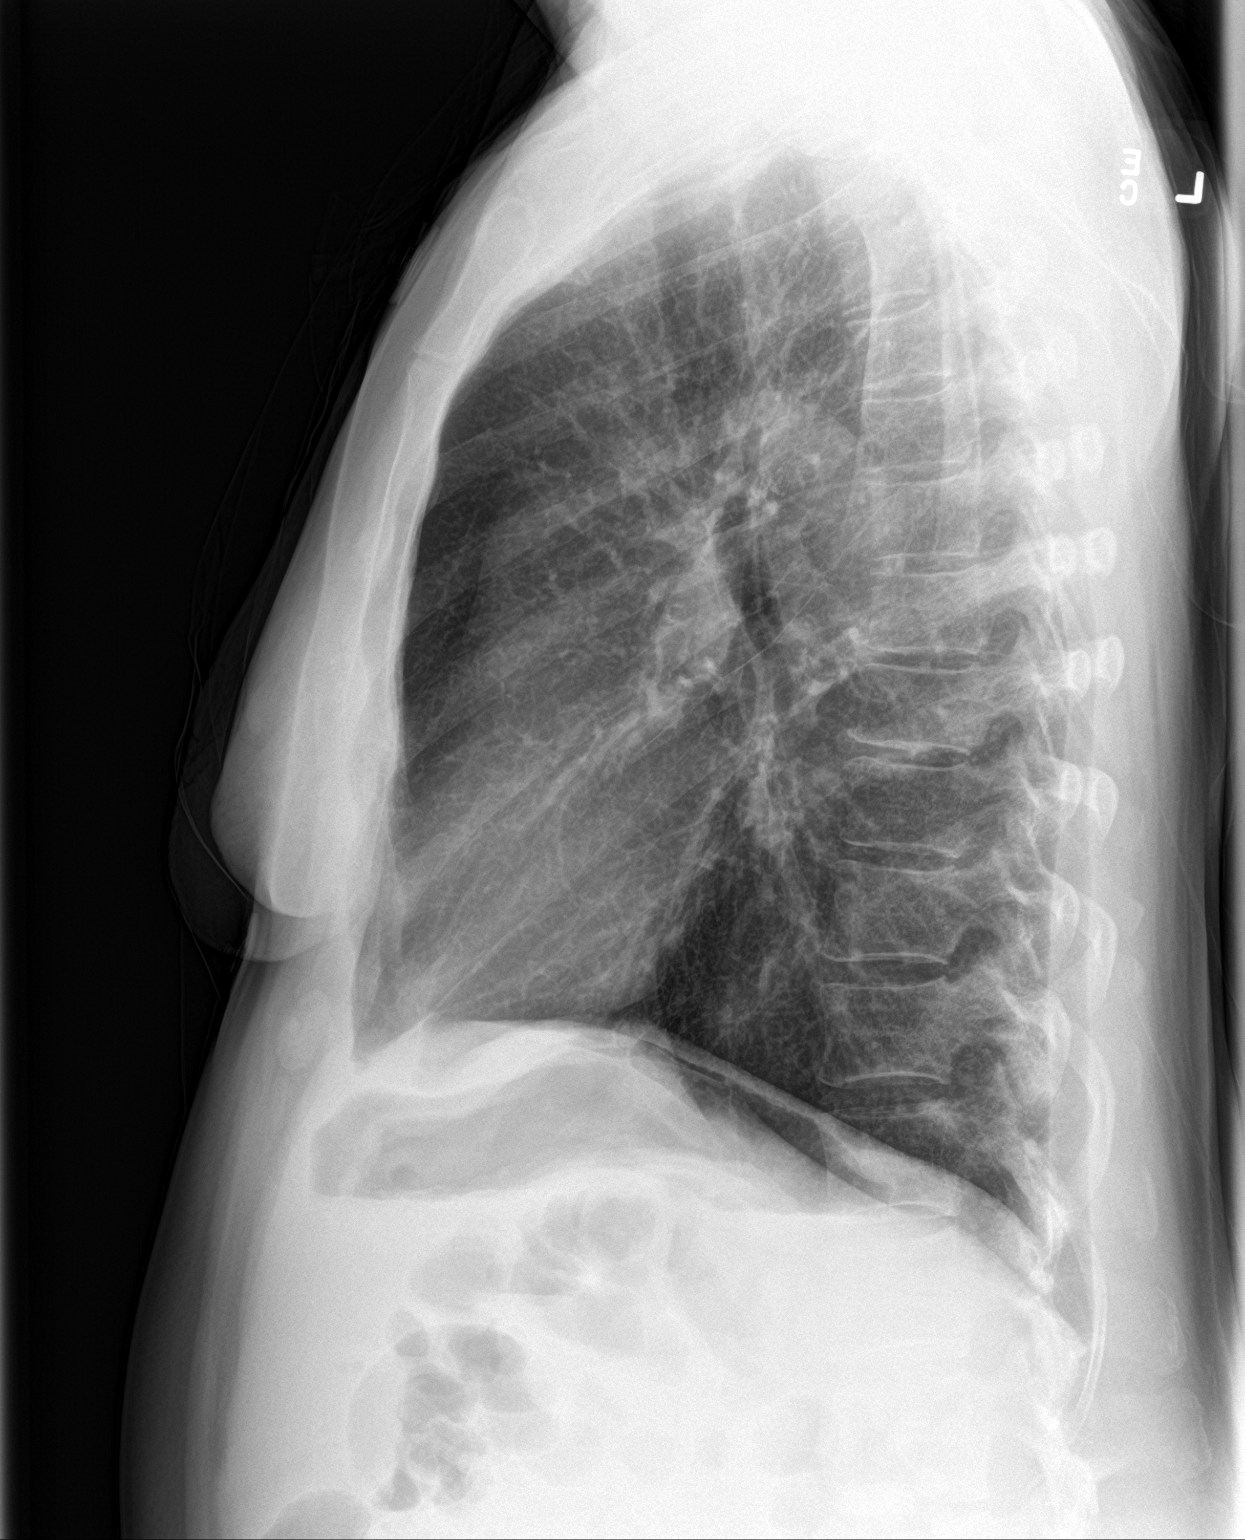

[2 of 2 positions shown; findings below may reference images not displayed]

FINDINGS: Lungs are clear.  No pleural effusion or pneumothorax.

The heart is normal in size.

Visualized osseous structures are within normal limits.
IMPRESSION: Normal chest radiographs.
